# Patient Record
Sex: Male | Born: 2000 | Race: White | Hispanic: No | Marital: Single | Smoking: Never smoker
Health system: Southern US, Community
[De-identification: ages and names within clinical notes are randomized; demographics above are authoritative.]

## PROBLEM LIST (undated history)

## (undated) DIAGNOSIS — J45909 Unspecified asthma, uncomplicated: Secondary | ICD-10-CM

---
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
## 2020-12-31 ENCOUNTER — Other Ambulatory Visit: Payer: Self-pay

## 2020-12-31 ENCOUNTER — Ambulatory Visit
Admission: EM | Admit: 2020-12-31 | Discharge: 2020-12-31 | Disposition: A | Discharge status: Home / Self Care | Payer: Self-pay | Attending: Emergency Medicine | Admitting: Emergency Medicine

## 2020-12-31 DIAGNOSIS — J069 Acute upper respiratory infection, unspecified: Secondary | ICD-10-CM | POA: Insufficient documentation

## 2020-12-31 DIAGNOSIS — J029 Acute pharyngitis, unspecified: Secondary | ICD-10-CM | POA: Insufficient documentation

## 2020-12-31 LAB — POCT RAPID STREP A (OFFICE): Rapid Strep A Screen: NEGATIVE

## 2020-12-31 MED ORDER — LIDOCAINE VISCOUS HCL 2 % MT SOLN: 15.0000 mL | OROMUCOSAL | 0 refills | Status: AC | PRN

## 2020-12-31 MED ORDER — CETIRIZINE-PSEUDOEPHEDRINE ER 5-120 MG PO TB12: 1.0000 | ORAL_TABLET | Freq: Every day | ORAL | 0 refills | Status: AC

## 2020-12-31 NOTE — Discharge Instructions (Addendum)
Strep test negative, will send out for culture and we will call you with results Declines test for mono at this time Get plenty of rest and push fluids Zyrtec D prescribed. Use daily for symptomatic relief Viscous lidocaine prescribed.  This is an oral solution you can swish, and gargle as needed for symptomatic relief of sore throat.  Do not exceed 8 doses in a 24 hour period.  Do not use prior to eating, as this will numb your entire mouth.   Drink warm or cool liquids, use throat lozenges, or popsicles to help alleviate symptoms Take OTC ibuprofen or tylenol as needed for pain Follow up with PCP if symptoms persists Return or go to ER if patient has any new or worsening symptoms such as fever, chills, nausea, vomiting, worsening sore throat, cough, abdominal pain, chest pain, changes in bowel or bladder habits, etc..Marland Kitchen

## 2020-12-31 NOTE — ED Triage Notes (Signed)
Pt presents with c/o sore throat  and nose bleed, pt states he has h/o strep

## 2020-12-31 NOTE — ED Provider Notes (Signed)
[facility]   [id number] 12/31/20 Arrival Time: 1427  [id number] THROAT  SUBJECTIVE: History from: patient.  Francis Lopez is a 20 y.o. male who presents with runny nose, congestion, sore throat, fatigue, and cough x 1-2 days.  Denies sick exposure to strep, flu or mono, or precipitating event.  Denies alleviating factors.  Symptoms are made worse with swallowing, but tolerating liquids and own secretions without difficulty.  Reports previous symptoms in the past with strep.   Denies SOB, wheezing, chest pain, nausea, rash, changes in bowel or bladder habits.    ROS: As per HPI.  All other pertinent ROS negative.     Past Medical History:  Diagnosis Date   Asthma    No past surgical history on file. Allergies  Allergen Reactions   Sulfa Antibiotics    No current facility-administered medications on file prior to encounter.   Current Outpatient Medications on File Prior to Encounter  Medication Sig Dispense Refill   loratadine (CLARITIN) 10 MG tablet Take 10 mg by mouth daily.     methylphenidate (RITALIN) 5 MG tablet Take 5 mg by mouth 2 (two) times daily.     Multiple Vitamin (MULTIVITAMIN WITH MINERALS) TABS Take 1 tablet by mouth daily.     Social History   Socioeconomic History   Marital status: Single    Spouse name: Not on file   Number of children: Not on file   Years of education: Not on file   Highest education level: Not on file  Occupational History   Not on file  Tobacco Use   Smoking status: Never   Smokeless tobacco: Never  Substance and Sexual Activity   Alcohol use: No   Drug use: No   Sexual activity: Not on file  Other Topics Concern   Not on file  Social History Narrative   Not on file   Social Determinants of Health   Financial Resource Strain: Not on file  Food Insecurity: Not on file  Transportation Needs: Not on file  Physical Activity: Not on file  Stress: Not on file  Social Connections: Not on file  Intimate Partner  Violence: Not on file   No family history on file.  OBJECTIVE:  Vitals:   12/31/20 1605  BP: 131/76  Pulse: (!) 110  Resp: 16  Temp: 99.4 F (37.4 C)  TempSrc: Oral  SpO2: 96%     General appearance: alert; well-appearing, nontoxic; speaking in full sentences and tolerating own secretions HEENT: NCAT; Ears: EACs clear, TMs pearly gray; Eyes: PERRL.  EOM grossly intact.Nose: nares patent without rhinorrhea, Throat: oropharynx clear, tonsils non erythematous or enlarged, uvula midline  Neck: supple without LAD Lungs: unlabored respirations, symmetrical air entry; cough: absent; no respiratory distress; CTAB Heart: regular rate and rhythm.  Skin: warm and dry Psychological: alert and cooperative; normal mood and affect   LABS: Results for orders placed or performed during the hospital encounter of 12/31/20 (from the past 24 hour(s))  POCT rapid strep A     Status: None   Collection Time: 12/31/20  4:19 PM  Result Value Ref Range   Rapid Strep A Screen Negative Negative     ASSESSMENT & PLAN:  1. Sore throat   2. Viral URI with cough     Meds ordered this encounter  Medications   cetirizine-pseudoephedrine (ZYRTEC-D) 5-120 MG tablet    Sig: Take 1 tablet by mouth daily.    Dispense:  30 tablet    Refill:  0  Order Specific Question:   Supervising Provider    Answer:   Eustace Moore [[id number]]   lidocaine (XYLOCAINE) 2 % solution    Sig: Use as directed 15 mLs in the mouth or throat as needed for mouth pain (Do NOT exceed 8 doses in a 24 hour period).    Dispense:  100 mL    Refill:  0    Order Specific Question:   Supervising Provider    Answer:   Eustace Moore [[id number]]    Strep test negative, will send out for culture and we will call you with results Declines test for mono at this time Get plenty of rest and push fluids Zyrtec D prescribed. Use daily for symptomatic relief Viscous lidocaine prescribed.  This is an oral solution you can swish, and  gargle as needed for symptomatic relief of sore throat.  Do not exceed 8 doses in a 24 hour period.  Do not use prior to eating, as this will numb your entire mouth.   Drink warm or cool liquids, use throat lozenges, or popsicles to help alleviate symptoms Take OTC ibuprofen or tylenol as needed for pain Follow up with PCP if symptoms persists Return or go to ER if patient has any new or worsening symptoms such as fever, chills, nausea, vomiting, worsening sore throat, cough, abdominal pain, chest pain, changes in bowel or bladder habits, etc...  Reviewed expectations re: course of current medical issues. Questions answered. Outlined signs and symptoms indicating need for more acute intervention. Patient verbalized understanding. After Visit Summary given.         Rennis Harding, PA-C 12/31/20 1651

## 2021-01-03 LAB — CULTURE, GROUP A STREP (THRC)

## 2022-06-02 DIAGNOSIS — Z13220 Encounter for screening for lipoid disorders: Secondary | ICD-10-CM | POA: Diagnosis not present

## 2022-06-02 DIAGNOSIS — Z0000 Encounter for general adult medical examination without abnormal findings: Secondary | ICD-10-CM | POA: Diagnosis not present

## 2022-06-02 DIAGNOSIS — Z131 Encounter for screening for diabetes mellitus: Secondary | ICD-10-CM | POA: Diagnosis not present

## 2022-07-07 DIAGNOSIS — R748 Abnormal levels of other serum enzymes: Secondary | ICD-10-CM | POA: Diagnosis not present

## 2023-06-14 DIAGNOSIS — Z0000 Encounter for general adult medical examination without abnormal findings: Secondary | ICD-10-CM | POA: Diagnosis not present
